# Patient Record
Sex: Male | Born: 2000 | Race: Black or African American | Hispanic: No | Marital: Single | Smoking: Never smoker
Health system: Southern US, Community
[De-identification: ages and names within clinical notes are randomized; demographics above are authoritative.]

## PROBLEM LIST (undated history)

## (undated) DIAGNOSIS — F909 Attention-deficit hyperactivity disorder, unspecified type: Secondary | ICD-10-CM

---
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
## 2021-06-30 ENCOUNTER — Emergency Department (HOSPITAL_COMMUNITY): Payer: Medicaid Other

## 2021-06-30 ENCOUNTER — Emergency Department (HOSPITAL_COMMUNITY)
Admission: EM | Admit: 2021-06-30 | Discharge: 2021-06-30 | Disposition: A | Discharge status: Home / Self Care | Payer: Medicaid Other | Attending: Emergency Medicine | Admitting: Emergency Medicine

## 2021-06-30 ENCOUNTER — Other Ambulatory Visit: Payer: Self-pay

## 2021-06-30 DIAGNOSIS — W3400XA Accidental discharge from unspecified firearms or gun, initial encounter: Secondary | ICD-10-CM | POA: Diagnosis not present

## 2021-06-30 DIAGNOSIS — S79921A Unspecified injury of right thigh, initial encounter: Secondary | ICD-10-CM | POA: Diagnosis present

## 2021-06-30 DIAGNOSIS — D72829 Elevated white blood cell count, unspecified: Secondary | ICD-10-CM | POA: Insufficient documentation

## 2021-06-30 DIAGNOSIS — S71101A Unspecified open wound, right thigh, initial encounter: Secondary | ICD-10-CM | POA: Insufficient documentation

## 2021-06-30 DIAGNOSIS — S71131A Puncture wound without foreign body, right thigh, initial encounter: Secondary | ICD-10-CM

## 2021-06-30 LAB — TYPE AND SCREEN
ABO/RH(D): AB POS
Antibody Screen: NEGATIVE

## 2021-06-30 LAB — CBC
HCT: 41.5 % (ref 39.0–52.0)
Hemoglobin: 14 g/dL (ref 13.0–17.0)
MCH: 30 pg (ref 26.0–34.0)
MCHC: 33.7 g/dL (ref 30.0–36.0)
MCV: 88.9 fL (ref 80.0–100.0)
Platelets: 258 10*3/uL (ref 150–400)
RBC: 4.67 MIL/uL (ref 4.22–5.81)
RDW: 12.1 % (ref 11.5–15.5)
WBC: 16.4 10*3/uL — ABNORMAL HIGH (ref 4.0–10.5)
nRBC: 0 % (ref 0.0–0.2)

## 2021-06-30 LAB — LACTIC ACID, PLASMA: Lactic Acid, Venous: 1.1 mmol/L (ref 0.5–1.9)

## 2021-06-30 LAB — COMPREHENSIVE METABOLIC PANEL
ALT: 31 U/L (ref 0–44)
AST: 26 U/L (ref 15–41)
Albumin: 3.7 g/dL (ref 3.5–5.0)
Alkaline Phosphatase: 54 U/L (ref 38–126)
Anion gap: 5 (ref 5–15)
BUN: 9 mg/dL (ref 6–20)
CO2: 25 mmol/L (ref 22–32)
Calcium: 8.4 mg/dL — ABNORMAL LOW (ref 8.9–10.3)
Chloride: 108 mmol/L (ref 98–111)
Creatinine, Ser: 1.19 mg/dL (ref 0.61–1.24)
GFR, Estimated: >60 mL/min (ref >60)
Glucose, Bld: 135 mg/dL — ABNORMAL HIGH (ref 70–99)
Potassium: 3.5 mmol/L (ref 3.5–5.1)
Sodium: 138 mmol/L (ref 135–145)
Total Bilirubin: 0.4 mg/dL (ref 0.3–1.2)
Total Protein: 6.7 g/dL (ref 6.5–8.1)

## 2021-06-30 LAB — I-STAT CHEM 8, ED
BUN: 9 mg/dL (ref 6–20)
Calcium, Ion: 1.15 mmol/L (ref 1.15–1.40)
Chloride: 105 mmol/L (ref 98–111)
Creatinine, Ser: 1 mg/dL (ref 0.61–1.24)
Glucose, Bld: 129 mg/dL — ABNORMAL HIGH (ref 70–99)
HCT: 41 % (ref 39.0–52.0)
Hemoglobin: 13.9 g/dL (ref 13.0–17.0)
Potassium: 3.2 mmol/L — ABNORMAL LOW (ref 3.5–5.1)
Sodium: 142 mmol/L (ref 135–145)
TCO2: 25 mmol/L (ref 22–32)

## 2021-06-30 LAB — PROTIME-INR
INR: 1 (ref 0.8–1.2)
Prothrombin Time: 12.9 seconds (ref 11.4–15.2)

## 2021-06-30 LAB — ETHANOL: Alcohol, Ethyl (B): <10 mg/dL (ref <10)

## 2021-06-30 MED ORDER — IBUPROFEN 400 MG PO TABS
400.0000 mg | ORAL_TABLET | Freq: Once | ORAL | Status: AC
  Administered 2021-06-30: 400 mg via ORAL
  Filled 2021-06-30: qty 1

## 2021-06-30 MED ORDER — HYDROCODONE-ACETAMINOPHEN 5-325 MG PO TABS
1.0000 | ORAL_TABLET | Freq: Once | ORAL | Status: AC
  Administered 2021-06-30: 1 via ORAL
  Filled 2021-06-30: qty 1

## 2021-06-30 MED ORDER — TETANUS-DIPHTH-ACELL PERTUSSIS 5-2.5-18.5 LF-MCG/0.5 IM SUSY: 0.5000 mL | PREFILLED_SYRINGE | Freq: Once | INTRAMUSCULAR | Status: DC
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
## 2021-06-30 NOTE — Progress Notes (Signed)
?   06/30/21 0035  ?Clinical Encounter Type  ?Visited With Patient not available  ?Visit Type Initial;Trauma  ?Referral From Nurse  ?Consult/Referral To Chaplain  ? ? ?Chaplain Tery Sanfilippo responded. The patient is being attended to by the medical team.There is no support person present. Chaplain remains available for additional support as needed. This note was prepared by Deneen Harts, M.Div..  For questions please contact by phone [phone].   ?

## 2021-06-30 NOTE — Progress Notes (Signed)
Orthopedic Tech Progress Note ?Patient Details:  ?Alex Macdonald ?2000-07-22 ?[id number] ? ?Ortho Devices ?Type of Ortho Device: Crutches ?Ortho Device/Splint Interventions: Ordered ?  ?Post Interventions ?Instructions Provided: Adjustment of device, Care of device, Poper ambulation with device ? ?Al Decant ?06/30/2021, 3:10 AM ? ?
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
## 2021-06-30 NOTE — ED Notes (Signed)
Pt leg rewrapped at this time  ?
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
## 2021-06-30 NOTE — Progress Notes (Signed)
Orthopedic Tech Progress Note ?Patient Details:  ?Trevaun Rendleman ?06-23-2000 ?[id number] ? ?Patient ID: Shi Blankenship, male   DOB: 01-20-2001, 21 y.o.   MRN: [id number] ?Level 2 trauma not needed at the moment.  ?Al Decant ?06/30/2021, 1:07 AM ? ?

## 2021-06-30 NOTE — ED Notes (Signed)
Patient verbalizes understanding of discharge instructions. Opportunity for questioning and answers were provided. Armband removed by staff, pt discharged from ED via wheelchair.  

## 2021-11-14 ENCOUNTER — Other Ambulatory Visit: Payer: Self-pay

## 2021-11-14 ENCOUNTER — Encounter: Payer: Self-pay | Admitting: Emergency Medicine

## 2021-11-14 ENCOUNTER — Emergency Department
Admission: EM | Admit: 2021-11-14 | Discharge: 2021-11-14 | Disposition: A | Discharge status: Home / Self Care | Payer: Medicaid Other | Attending: Emergency Medicine | Admitting: Emergency Medicine

## 2021-11-14 DIAGNOSIS — Z202 Contact with and (suspected) exposure to infections with a predominantly sexual mode of transmission: Secondary | ICD-10-CM | POA: Diagnosis present

## 2021-11-14 LAB — RAPID HIV SCREEN (HIV 1/2 AB+AG)
HIV 1/2 Antibodies: NONREACTIVE
HIV-1 P24 Antigen - HIV24: NONREACTIVE

## 2021-11-14 LAB — URINALYSIS, ROUTINE W REFLEX MICROSCOPIC
Bacteria, UA: NONE SEEN
Bilirubin Urine: NEGATIVE
Glucose, UA: NEGATIVE mg/dL
Hgb urine dipstick: NEGATIVE
Ketones, ur: NEGATIVE mg/dL
Nitrite: NEGATIVE
Protein, ur: NEGATIVE mg/dL
Specific Gravity, Urine: 1.021 (ref 1.005–1.030)
Squamous Epithelial / HPF: NONE SEEN (ref 0–5)
WBC, UA: >50 WBC/hpf — ABNORMAL HIGH (ref 0–5)
pH: 5 (ref 5.0–8.0)

## 2021-11-14 LAB — CHLAMYDIA/NGC RT PCR (ARMC ONLY)
Chlamydia Tr: DETECTED — AB
N gonorrhoeae: DETECTED — AB

## 2021-11-14 MED ORDER — CEFTRIAXONE SODIUM 1 G IJ SOLR
500.0000 mg | Freq: Once | INTRAMUSCULAR | Status: AC
  Administered 2021-11-14: 500 mg via INTRAMUSCULAR
  Filled 2021-11-14: qty 10

## 2021-11-14 MED ORDER — DOXYCYCLINE MONOHYDRATE 100 MG PO TABS: 100.0000 mg | ORAL_TABLET | Freq: Two times a day (BID) | ORAL | 0 refills | Status: AC
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
## 2021-11-14 NOTE — ED Notes (Signed)
Pt c/o red skin on face since Friday. Pt stated he has had benedryl tabs and cream. Pt admitted [facility] On Friday.

## 2021-11-14 NOTE — ED Notes (Signed)
No reaction noted from the injection  Pt and family eager to leave Provider aware

## 2021-11-14 NOTE — Discharge Instructions (Signed)
You did not wish to wait for the results of your tests today.  You were treated empirically with ceftriaxone, but you also need to take the doxycycline for 1 week in order to be fully treated.  Remember that there are many other STDs that and you should follow up with a primary care doctor to have the full panel of testing performed. Please do not have sexual intercourse until fully and properly tested and treated. Your partner(s) also needs to be tested and treated fully.  Please return for any new, worsening, or change in symptoms or other concerns.

## 2021-11-14 NOTE — ED Provider Notes (Signed)
[facility] Provider Note    Event Date/Time   First MD Initiated Contact with Patient 11/14/21 1130     (approximate)   History   SEXUALLY TRANSMITTED DISEASE   HPI  Alex Macdonald is a 21 y.o. male who presents today for evaluation of possible STD exposure.  Patient reports that he was told by someone that he recently had intercourse with that she has tested positive for chlamydia.  Patient denies any symptoms.  He has not noticed any dysuria or discharge.  He has not noticed any penile lesions.  He denies any testicular pain or abdominal pain.     Physical Exam   Triage Vital Signs: ED Triage Vitals  Enc Vitals Group     BP 11/14/21 1127 129/71     Pulse Rate 11/14/21 1127 (!) 57     Resp 11/14/21 1127 15     Temp 11/14/21 1127 97.9 F (36.6 C)     Temp Source 11/14/21 1127 Oral     SpO2 11/14/21 1127 98 %     Weight 11/14/21 1124 179 lb 14.3 oz (81.6 kg)     Height 11/14/21 1124 5\' 7"  (1.702 m)     Head Circumference --      Peak Flow --      Pain Score 11/14/21 1124 0     Pain Loc --      Pain Edu? --      Excl. in [place]? --     Most recent vital signs: Vitals:   11/14/21 1127  BP: 129/71  Pulse: (!) 57  Resp: 15  Temp: 97.9 F (36.6 C)  SpO2: 98%    Physical Exam Vitals and nursing note reviewed.  Constitutional:      General: Awake and alert. No acute distress.    Appearance: Normal appearance. The patient is normal weight.  HENT:     Head: Normocephalic and atraumatic.     Mouth: Mucous membranes are moist.  Eyes:     General: PERRL. Normal EOMs        Right eye: No discharge.        Left eye: No discharge.     Conjunctiva/sclera: Conjunctivae normal.  Cardiovascular:     Rate and Rhythm: Normal rate and regular rhythm.     Pulses: Normal pulses.     Heart sounds: Normal heart sounds Pulmonary:     Effort: Pulmonary effort is normal. No respiratory distress.     Breath sounds: Normal breath sounds.  Abdominal:      Abdomen is soft. There is no abdominal tenderness. No rebound or guarding. No distention. Normal appearing penis and testicles. No lesions. No testicular swelling or tenderness Musculoskeletal:        General: No swelling. Normal range of motion.     Cervical back: Normal range of motion and neck supple.  Skin:    General: Skin is warm and dry.     Capillary Refill: Capillary refill takes less than 2 seconds.     Findings: No rash.  Neurological:     Mental Status: The patient is awake and alert.      ED Results / Procedures / Treatments   Labs (all labs ordered are listed, but only abnormal results are displayed) Labs Reviewed  URINALYSIS, ROUTINE W REFLEX MICROSCOPIC - Abnormal; Notable for the following components:      Result Value   Color, Urine YELLOW (*)    APPearance CLOUDY (*)    Leukocytes,Ua  LARGE (*)    WBC, UA >50 (*)    All other components within normal limits  CHLAMYDIA/NGC RT PCR ([facility] ONLY)            HIV ANTIBODY (ROUTINE TESTING W REFLEX)  RAPID HIV SCREEN (HIV 1/2 AB+AG)  RPR     EKG     RADIOLOGY     PROCEDURES:  Critical Care performed:   Procedures   MEDICATIONS ORDERED IN ED: Medications  cefTRIAXone (ROCEPHIN) injection 500 mg (500 mg Intramuscular Given 11/14/21 1243)     IMPRESSION / MDM / ASSESSMENT AND PLAN / ED COURSE  I reviewed the triage vital signs and the nursing notes.   Differential diagnosis includes, but is not limited to, gonorrhea, chlamydia, HIV, syphilis, UTI, urethritis.  No testicular pain or swelling to suggest epididymitis or orchitis.  Patient denies any symptoms at all.  He agreed to work-up including gonorrhea and Chlamydia testing, HIV and syphilis testing.  However, patient did not want to wait for the results.  He reports that he is able to get the results on MyChart.  He was treated empirically with ceftriaxone IM and doxycycline.  He was advised of the importance of taking the doxycycline for the full  duration of treatment.  Patient was advised that there are many other STDs that patient could have that we do not routinely test for. Patient was advised to follow up with a primary care doctor to have the full panel of testing performed. Patient was advised to not have sexual intercourse until fully and properly tested and fully treated. Patient was advised that his partner also needs to be tested and treated. Patient understands and agrees with plan.  He was discharged in stable condition.   Patient's presentation is most consistent with acute complicated illness / injury requiring diagnostic workup.      FINAL CLINICAL IMPRESSION(S) / ED DIAGNOSES   Final diagnoses:  Exposure to STD     Rx / DC Orders   ED Discharge Orders          Ordered    doxycycline (ADOXA) 100 MG tablet  2 times daily        11/14/21 1302             Note:  This document was prepared using Dragon voice recognition software and may include unintentional dictation errors.   Keturah Shavers 11/14/21 1320    Chesley Noon, MD 11/14/21 [phone]
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
## 2021-11-14 NOTE — ED Notes (Signed)
Does not want to wait. Wants to be D/C

## 2021-11-15 ENCOUNTER — Telehealth: Payer: Self-pay | Admitting: Emergency Medicine

## 2021-11-15 LAB — HIV ANTIBODY (ROUTINE TESTING W REFLEX): HIV Screen 4th Generation wRfx: NONREACTIVE

## 2021-11-15 LAB — RPR: RPR Ser Ql: NONREACTIVE

## 2021-11-15 NOTE — Telephone Encounter (Signed)
Called patient to inform of std test results.  Gave him results and told him to make sure the other person was also treated for gonorrhea as the chart indicated the other person had chlamydia.  Also notifiy any other partners and no sex for 2 weeks after treatment.

## 2021-11-22 ENCOUNTER — Emergency Department
Admission: EM | Admit: 2021-11-22 | Discharge: 2021-11-22 | Disposition: A | Discharge status: Home / Self Care | Payer: Medicaid Other | Attending: Emergency Medicine | Admitting: Emergency Medicine

## 2021-11-22 ENCOUNTER — Emergency Department: Payer: Medicaid Other

## 2021-11-22 ENCOUNTER — Other Ambulatory Visit: Payer: Self-pay

## 2021-11-22 DIAGNOSIS — L03113 Cellulitis of right upper limb: Secondary | ICD-10-CM | POA: Insufficient documentation

## 2021-11-22 DIAGNOSIS — M79642 Pain in left hand: Secondary | ICD-10-CM | POA: Diagnosis present

## 2021-11-22 DIAGNOSIS — L03114 Cellulitis of left upper limb: Secondary | ICD-10-CM | POA: Insufficient documentation

## 2021-11-22 MED ORDER — AMOXICILLIN-POT CLAVULANATE 875-125 MG PO TABS: 1.0000 | ORAL_TABLET | Freq: Two times a day (BID) | ORAL | 0 refills | Status: AC

## 2021-11-22 MED ORDER — AMOXICILLIN-POT CLAVULANATE 875-125 MG PO TABS
1.0000 | ORAL_TABLET | Freq: Once | ORAL | Status: AC
  Administered 2021-11-22: 1 via ORAL
  Filled 2021-11-22: qty 1
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
## 2021-11-22 NOTE — Discharge Instructions (Addendum)
As we discussed please start your antibiotics this evening 11/22/2021 and continue taking 1 tablet with breakfast and dinner for the next 10 days.  Return to the emergency department for any significant increase in swelling, any fever, or any other symptom personally concerning to yourself.

## 2021-11-22 NOTE — ED Notes (Signed)
Patient transported to X-ray 

## 2021-11-22 NOTE — ED Notes (Signed)
At this time, patient does not want ice pack

## 2022-01-31 ENCOUNTER — Emergency Department
Admission: EM | Admit: 2022-01-31 | Discharge: 2022-01-31 | Disposition: A | Discharge status: Home / Self Care | Payer: No Typology Code available for payment source | Attending: Emergency Medicine | Admitting: Emergency Medicine

## 2022-01-31 DIAGNOSIS — R456 Violent behavior: Secondary | ICD-10-CM | POA: Diagnosis present

## 2022-01-31 DIAGNOSIS — R45851 Suicidal ideations: Secondary | ICD-10-CM | POA: Diagnosis not present

## 2022-01-31 DIAGNOSIS — F4323 Adjustment disorder with mixed anxiety and depressed mood: Secondary | ICD-10-CM | POA: Diagnosis not present

## 2022-01-31 DIAGNOSIS — F4325 Adjustment disorder with mixed disturbance of emotions and conduct: Secondary | ICD-10-CM | POA: Insufficient documentation

## 2022-01-31 LAB — URINE DRUG SCREEN, QUALITATIVE (ARMC ONLY)
Amphetamines, Ur Screen: NOT DETECTED
Barbiturates, Ur Screen: NOT DETECTED
Benzodiazepine, Ur Scrn: NOT DETECTED
Cannabinoid 50 Ng, Ur ~~LOC~~: POSITIVE — AB
Cocaine Metabolite,Ur ~~LOC~~: NOT DETECTED
MDMA (Ecstasy)Ur Screen: NOT DETECTED
Methadone Scn, Ur: NOT DETECTED
Opiate, Ur Screen: NOT DETECTED
Phencyclidine (PCP) Ur S: NOT DETECTED
Tricyclic, Ur Screen: NOT DETECTED

## 2022-01-31 LAB — CBC
HCT: 41.5 % (ref 39.0–52.0)
Hemoglobin: 13.8 g/dL (ref 13.0–17.0)
MCH: 29.9 pg (ref 26.0–34.0)
MCHC: 33.3 g/dL (ref 30.0–36.0)
MCV: 89.8 fL (ref 80.0–100.0)
Platelets: 287 10*3/uL (ref 150–400)
RBC: 4.62 MIL/uL (ref 4.22–5.81)
RDW: 13.2 % (ref 11.5–15.5)
WBC: 15.3 10*3/uL — ABNORMAL HIGH (ref 4.0–10.5)
nRBC: 0 % (ref 0.0–0.2)

## 2022-01-31 LAB — COMPREHENSIVE METABOLIC PANEL
ALT: 28 U/L (ref 0–44)
AST: 21 U/L (ref 15–41)
Albumin: 4.2 g/dL (ref 3.5–5.0)
Alkaline Phosphatase: 57 U/L (ref 38–126)
Anion gap: 7 (ref 5–15)
BUN: 19 mg/dL (ref 6–20)
CO2: 24 mmol/L (ref 22–32)
Calcium: 9.2 mg/dL (ref 8.9–10.3)
Chloride: 113 mmol/L — ABNORMAL HIGH (ref 98–111)
Creatinine, Ser: 1.24 mg/dL (ref 0.61–1.24)
GFR, Estimated: >60 mL/min (ref >60)
Glucose, Bld: 104 mg/dL — ABNORMAL HIGH (ref 70–99)
Potassium: 3.9 mmol/L (ref 3.5–5.1)
Sodium: 144 mmol/L (ref 135–145)
Total Bilirubin: 0.7 mg/dL (ref 0.3–1.2)
Total Protein: 7.7 g/dL (ref 6.5–8.1)

## 2022-01-31 LAB — ACETAMINOPHEN LEVEL: Acetaminophen (Tylenol), Serum: <10 ug/mL — ABNORMAL LOW (ref 10–30)

## 2022-01-31 LAB — ETHANOL: Alcohol, Ethyl (B): <10 mg/dL (ref <10)

## 2022-01-31 LAB — SALICYLATE LEVEL: Salicylate Lvl: <7 mg/dL — ABNORMAL LOW (ref 7.0–30.0)

## 2022-01-31 MED ORDER — LORAZEPAM 2 MG/ML IJ SOLN
2.0000 mg | INTRAMUSCULAR | Status: DC | PRN
Start: 2022-01-31 — End: 2022-01-31

## 2022-01-31 MED ORDER — DIPHENHYDRAMINE HCL 50 MG/ML IJ SOLN
50.0000 mg | Freq: Four times a day (QID) | INTRAMUSCULAR | Status: DC | PRN
Start: 2022-01-31 — End: 2022-01-31

## 2022-01-31 MED ORDER — HALOPERIDOL LACTATE 5 MG/ML IJ SOLN: 5.0000 mg | Freq: Four times a day (QID) | INTRAMUSCULAR | Status: DC | PRN

## 2022-01-31 NOTE — ED Notes (Signed)
Hospital meal provided.  100% consumed, pt tolerated w/o complaints.  Waste discarded appropriately.   

## 2022-01-31 NOTE — Consult Note (Signed)
[facility] Face-to-Face Psychiatry Consult   Reason for Consult:  Psych evaluation Referring Physician:  Dr. Vicente Males Patient Identification: Alex Macdonald MRN:  [id number] Principal Diagnosis: Suicidal ideation Diagnosis:  Principal Problem:   Suicidal ideation Active Problems:   Adjustment disorder with mixed disturbance of emotions and conduct   Total Time spent with patient: 45 minutes  Subjective:   " If I wanted to kill myself I would have done it"  HPI:  Alex Macdonald, 21 y.o., Alex Macdonald patient seen by this provider; chart reviewed and consulted with Dr. Vicente Males on 11/27/Alex.  On approach Alex Macdonald is guarded and not open to participating in the assessment.  This Clinical research associate explained why it was important to answer questions and to participate.  He then began to open up just slightly. It becomes apparent that patient is still very upset about 1. Whatever he was arguing with his girlfriend about and 2. Being here in the ER.   Per chart review, patient has a history or adjustment disorder and ADHD. There is no prior hx of SA.  He presented to the er as a recommendation by [facility] for passive SI, where he states,  "I never meant that I was going to hurt myself." He said, "I only thought if I was not around anymore, would people miss me or be sad that I am gone."    It was discussed with both providers that the patient does not meet criteria to be admitted to the psychiatric inpatient unit, and that an observation and reassessment this am may be sufficient for discharge, with recommended follow-up.  The patient does not appear to be responding to internal or external stimuli. Neither is the patient presenting with any delusional thinking. The patient denies auditory or visual hallucinations. The patient denies suicidal, homicidal, or self-harm ideations. The patient is not presenting with any psychotic or paranoid behaviors. He denies changes in sleep or appetite. During  encounter with the patient, he was able  to answer questions.     Disposition:  reassess in the am   Past Psychiatric History: Adjustment disorder  Risk to Self:   Risk to Others:   Prior Inpatient Therapy:   Prior Outpatient Therapy:    Past Medical History:  Past Medical History:  Diagnosis Date   ADHD (attention deficit hyperactivity disorder)    No past surgical history on file. Family History: No family history on file. Family Psychiatric  History:  Social History:  Social History   Substance and Sexual Activity  Alcohol Use No     Social History   Substance and Sexual Activity  Drug Use Never    Social History   Socioeconomic History   Marital status: Single    Spouse name: Not on file   Number of children: Not on file   Years of education: Not on file   Highest education level: Not on file  Occupational History   Not on file  Tobacco Use   Smoking status: Never   Smokeless tobacco: Never  Substance and Sexual Activity   Alcohol use: No   Drug use: Never   Sexual activity: Not on file  Other Topics Concern   Not on file  Social History Narrative   Not on file   Social Determinants of Health   Financial Resource Strain: Not on file  Food Insecurity: Not on file  Transportation Needs: Not on file  Physical Activity: Not on file  Stress: Not on file  Social Connections: Not on file  Additional Social History:    Allergies:  No Known Allergies  Labs: No results found for this or any previous visit (from the past 48 hour(s)).  Current Facility-Administered Medications  Medication Dose Route Frequency Provider Last Rate Last Admin   diphenhydrAMINE (BENADRYL) injection 50 mg  50 mg Intramuscular Q6H PRN Bradler, Clent Jacks, MD       haloperidol lactate (HALDOL) injection 5 mg  5 mg Intramuscular Q6H PRN Bradler, Clent Jacks, MD       LORazepam (ATIVAN) injection 2 mg  2 mg Intramuscular Q4H PRN Merwyn Katos, MD       Current Outpatient Medications  Medication Sig Dispense Refill    amoxicillin-clavulanate (AUGMENTIN) 875-125 MG tablet Take 1 tablet by mouth 2 (two) times daily. 20 tablet 0   cloNIDine (CATAPRES) 0.1 MG tablet Take 0.1 mg by mouth 2 (two) times daily. (Patient not taking: Reported on 11/14/2021)      Musculoskeletal: Strength & Muscle Tone: within normal limits Gait & Station: normal Patient leans: N/A   Psychiatric Specialty Exam:  Presentation  General Appearance: Appropriate for Environment  Eye Contact:Fair  Speech:Clear and Coherent  Speech Volume:Decreased  Handedness:Right   Mood and Affect  Mood:Anxious; Labile; Irritable  Affect:Appropriate; Flat   Thought Process  Thought Processes:Coherent  Descriptions of Associations:Intact  Orientation:Full (Time, Place and Person)  Thought Content:Illogical; WDL  History of Schizophrenia/Schizoaffective disorder:No data recorded Duration of Psychotic Symptoms:No data recorded Hallucinations:Hallucinations: None  Ideas of Reference:None  Suicidal Thoughts:Suicidal Thoughts: No  Homicidal Thoughts:Homicidal Thoughts: No   Sensorium  Memory:Immediate Fair; Recent Fair  Judgment:Poor  Insight:Poor   Executive Functions  Concentration:Fair  Attention Span:Fair  Recall:Fair  Fund of Knowledge:Fair  Language:Fair   Psychomotor Activity  Psychomotor Activity:Psychomotor Activity: Normal   Assets  Assets:Communication Skills; Financial Resources/Insurance; Housing; Physical Health; Social Support   Sleep  Sleep:Sleep: Fair   Physical Exam: Physical Exam Vitals and nursing note reviewed.    ROS Blood pressure (!) 148/94, pulse 88, temperature 98.1 F (36.7 C), temperature source Oral, resp. rate 17, height 5\' 7"  (1.702 m), weight 69.5 kg, SpO2 100 %. Body mass index is 24 kg/m.   Disposition: No evidence of imminent risk to self or others at present.   Patient does not meet criteria for psychiatric inpatient admission. Refer to IOP. Discussed  crisis plan, support from social network, calling 911, coming to the Emergency Department, and calling Suicide Hotline.  , NP 01/31/2022 5:00 AM

## 2022-01-31 NOTE — Discharge Instructions (Addendum)
Call your outpatient provider to talk about getting back on medication for ADHD. You can also ogo to [facility] for mental Health Services: [address], [place]. The contact for [facility] is Unk Pinto [phone]

## 2022-01-31 NOTE — Consult Note (Signed)
[facility]  01/31/2022 10:54 AM Alex Macdonald  MRN:  [id number]   Method of visit?: Face to Face   Subjective:"I am not suicidal" Principal Problem: Suicidal ideation Diagnosis:  Principal Problem:   Suicidal ideation Active Problems:   Adjustment disorder with mixed disturbance of emotions and conduct   Adjustment disorder with mixed anxiety and depressed mood  Total Time spent with patient: 20 minutes  Past Psychiatric History: Reassessment. Patient continues to deny suicidal or homicidal ideations. No unsafe behaviors in the ED. Patient denies auditory and visual hallucinations. Does not appear to be responding to internal stimuli. Is calm and cooperative.  Collateral gained from patient's mother, with whom patient lives [place](Alex Macdonald [phone]).  She states that patient does tend to be impulsive. He has ADHD and has not been medicated for several years, but he has talked about going back on medication.   Patient no longer meets criteria for involuntary commitment. IVC released     Past Medical History:  Past Medical History:  Diagnosis Date   ADHD (attention deficit hyperactivity disorder)    No past surgical history on file. Family History: No family history on file. Family Psychiatric  History:  Social History:  Social History   Substance and Sexual Activity  Alcohol Use No     Social History   Substance and Sexual Activity  Drug Use Never    Social History   Socioeconomic History   Marital status: Single    Spouse name: Not on file   Number of children: Not on file   Years of education: Not on file   Highest education level: Not on file  Occupational History   Not on file  Tobacco Use   Smoking status: Never   Smokeless tobacco: Never  Substance and Sexual Activity   Alcohol use: No   Drug use: Never   Sexual activity: Not on file  Other Topics Concern   Not on file  Social History Narrative   Not on file   Social Determinants of  Health   Financial Resource Strain: Not on file  Food Insecurity: Not on file  Transportation Needs: Not on file  Physical Activity: Not on file  Stress: Not on file  Social Connections: Not on file    Sleep: Good  Appetite:  Good  Current Medications: Current Facility-Administered Medications  Medication Dose Route Frequency Provider Last Rate Last Admin   diphenhydrAMINE (BENADRYL) injection 50 mg  50 mg Intramuscular Q6H PRN Bradler, Clent JacksEvan K, MD       haloperidol lactate (HALDOL) injection 5 mg  5 mg Intramuscular Q6H PRN Bradler, Clent JacksEvan K, MD       LORazepam (ATIVAN) injection 2 mg  2 mg Intramuscular Q4H PRN Merwyn KatosBradler, Evan K, MD       Current Outpatient Medications  Medication Sig Dispense Refill   amoxicillin-clavulanate (AUGMENTIN) 875-125 MG tablet Take 1 tablet by mouth 2 (two) times daily. (Patient not taking: Reported on 01/31/2022) 20 tablet 0   cloNIDine (CATAPRES) 0.1 MG tablet Take 0.1 mg by mouth 2 (two) times daily. (Patient not taking: Reported on 11/14/2021)      Lab Results:  Results for orders placed or performed during the hospital encounter of 01/31/22 (from the past 48 hour(s))  Comprehensive metabolic panel     Status: Abnormal   Collection Time: 01/31/22  4:00 AM  Result Value Ref Range   Sodium 144 135 - 145 mmol/L   Potassium 3.9 3.5 - 5.1 mmol/L  Chloride 113 (H) 98 - 111 mmol/L   CO2 24 22 - 32 mmol/L   Glucose, Bld 104 (H) 70 - 99 mg/dL    Comment: Glucose reference range applies only to samples taken after fasting for at least 8 hours.   BUN 19 6 - 20 mg/dL   Creatinine, Ser 4.40 0.61 - 1.24 mg/dL   Calcium 9.2 8.9 - 34.7 mg/dL   Total Protein 7.7 6.5 - 8.1 g/dL   Albumin 4.2 3.5 - 5.0 g/dL   AST 21 15 - 41 U/L   ALT 28 0 - 44 U/L   Alkaline Phosphatase 57 38 - 126 U/L   Total Bilirubin 0.7 0.3 - 1.2 mg/dL   GFR, Estimated >42 >59 mL/min    Comment: (NOTE) Calculated using the CKD-EPI Creatinine Equation (2021)    Anion gap 7 5 - 15     Comment: Performed at [facility], [address] [place], [place] [address]  Ethanol     Status: None   Collection Time: 01/31/22  4:00 AM  Result Value Ref Range   Alcohol, Ethyl (B) <10 <10 mg/dL    Comment: (NOTE) Lowest detectable limit for serum alcohol is 10 mg/dL.  For medical purposes only. Performed at [facility], [address] [place], [place] [address]   Salicylate level     Status: Abnormal   Collection Time: 01/31/22  4:00 AM  Result Value Ref Range   Salicylate Lvl <7.0 (L) 7.0 - 30.0 mg/dL    Comment: Performed at [facility], [address] [place], [place] [address]  Acetaminophen level     Status: Abnormal   Collection Time: 01/31/22  4:00 AM  Result Value Ref Range   Acetaminophen (Tylenol), Serum <10 (L) 10 - 30 ug/mL    Comment: (NOTE) Therapeutic concentrations vary significantly. A range of 10-30 ug/mL  may be an effective concentration for many patients. However, some  are best treated at concentrations outside of this range. Acetaminophen concentrations >150 ug/mL at 4 hours after ingestion  and >50 ug/mL at 12 hours after ingestion are often associated with  toxic reactions.  Performed at [facility], [address] [place], [place] [address]   cbc     Status: Abnormal   Collection Time: 01/31/22  4:00 AM  Result Value Ref Range   WBC 15.3 (H) 4.0 - 10.5 K/uL   RBC 4.62 4.22 - 5.81 MIL/uL   Hemoglobin 13.8 13.0 - 17.0 g/dL   HCT 06.3 01.6 - 01.0 %   MCV 89.8 80.0 - 100.0 fL   MCH 29.9 26.0 - 34.0 pg   MCHC 33.3 30.0 - 36.0 g/dL   RDW 93.2 35.5 - 73.2 %   Platelets 287 150 - 400 K/uL   nRBC 0.0 0.0 - 0.2 %    Comment: Performed at [facility], [address], [place], [place] [address]  Urine Drug Screen, Qualitative     Status: Abnormal   Collection Time: 01/31/22 10:00 AM  Result Value Ref Range   Tricyclic, Ur Screen NONE DETECTED NONE DETECTED   Amphetamines, Ur Screen NONE  DETECTED NONE DETECTED   MDMA (Ecstasy)Ur Screen NONE DETECTED NONE DETECTED   Cocaine Metabolite,Ur [place] NONE DETECTED NONE DETECTED   Opiate, Ur Screen NONE DETECTED NONE DETECTED   Phencyclidine (PCP) Ur S NONE DETECTED NONE DETECTED   Cannabinoid 50 Ng, Ur [place] POSITIVE (A) NONE DETECTED   Barbiturates, Ur Screen NONE DETECTED NONE DETECTED  Benzodiazepine, Ur Scrn NONE DETECTED NONE DETECTED   Methadone Scn, Ur NONE DETECTED NONE DETECTED    Comment: (NOTE) Tricyclics + metabolites, urine    Cutoff 1000 ng/mL Amphetamines + metabolites, urine  Cutoff 1000 ng/mL MDMA (Ecstasy), urine              Cutoff 500 ng/mL Cocaine Metabolite, urine          Cutoff 300 ng/mL Opiate + metabolites, urine        Cutoff 300 ng/mL Phencyclidine (PCP), urine         Cutoff 25 ng/mL Cannabinoid, urine                 Cutoff 50 ng/mL Barbiturates + metabolites, urine  Cutoff 200 ng/mL Benzodiazepine, urine              Cutoff 200 ng/mL Methadone, urine                   Cutoff 300 ng/mL  The urine drug screen provides only a preliminary, unconfirmed analytical test result and should not be used for non-medical purposes. Clinical consideration and professional judgment should be applied to any positive drug screen result due to possible interfering substances. A more specific alternate chemical method must be used in order to obtain a confirmed analytical result. Gas chromatography / mass spectrometry (GC/MS) is the preferred confirm atory method. Performed at [facility], [address] [place], [place] [address]     Blood Alcohol level:  Lab Results  Component Value Date   Avala <10 01/31/2022   ETH <10 06/30/2021    Physical Findings: AIMS:  , ,  ,  ,    CIWA:    COWS:     Musculoskeletal: Strength & Muscle Tone: within normal limits Gait & Station: normal Patient leans: N/A  Psychiatric Specialty Exam:  Presentation  General Appearance:  Casual  Eye  Contact: Fair  Speech: Clear and Coherent  Speech Volume: Normal  Handedness: Right   Mood and Affect  Mood: Euthymic  Affect: Blunt   Thought Process  Thought Processes: Coherent  Descriptions of Associations:Intact  Orientation:Full (Time, Place and Person)  Thought Content:WDL  History of Schizophrenia/Schizoaffective disorder:No data recorded Duration of Psychotic Symptoms:No data recorded Hallucinations:Hallucinations: None  Ideas of Reference:None  Suicidal Thoughts:Suicidal Thoughts: No  Homicidal Thoughts:Homicidal Thoughts: No   Sensorium  Memory: Immediate Good  Judgment: Fair  Insight: Poor   Executive Functions  Concentration: Fair  Attention Span: Fair  Recall: Fair  Fund of Knowledge: Fair  Language: Fair   Psychomotor Activity  Psychomotor Activity: Psychomotor Activity: Normal   Assets  Assets: Housing   Sleep  Sleep: Sleep: Good    Physical Exam: Physical Exam Vitals and nursing note reviewed.  HENT:     Head: Normocephalic.     Nose: No congestion or rhinorrhea.  Eyes:     General:        Right eye: No discharge.        Left eye: No discharge.  Pulmonary:     Effort: Pulmonary effort is normal.  Musculoskeletal:        General: Normal range of motion.     Cervical back: Normal range of motion.  Skin:    General: Skin is dry.  Neurological:     Mental Status: He is alert and oriented to person, place, and time.  Psychiatric:        Attention and Perception: Attention normal.  Mood and Affect: Affect is blunt.        Speech: Speech normal.        Behavior: Behavior normal.        Thought Content: Thought content normal. Thought content is not paranoid. Thought content does not include homicidal or suicidal ideation.        Cognition and Memory: Cognition normal.        Judgment: Judgment is impulsive.    Review of Systems  Constitutional: Negative.   HENT: Negative.    Eyes:  Negative.   Respiratory: Negative.    Musculoskeletal: Negative.   Psychiatric/Behavioral:  Positive for depression (denies). Negative for suicidal ideas.    Blood pressure 119/60, pulse 82, temperature 98 F (36.7 C), temperature source Oral, resp. rate 14, height 5\' 7"  (1.702 m), weight 69.5 kg, SpO2 99 %. Body mass index is 24 kg/m.  Treatment Plan Summary: Plan Patient does not meet criteria for inpatient psychiatric hospitalization. Refer to outpatient provider to discus possibility of getting back on his medication for ADHD. Also refer to [facility] for mental health services. Reviewed with EDP   , NP 01/31/2022, 10:54 AM

## 2022-01-31 NOTE — ED Triage Notes (Signed)
Pt arrived via [facility] from his home after pt and girlfriend had argument and pt expressed to girlfriend that he was going to hurt himself. Girlfriend called 911. Per affidavit, pt was combative the entire time with officers and continuously stated he did not want to live anymore and wanted to die. Pt arrived to ED in forensic restraints in which were removed for assessment. Pt refusing to answer SI and HI questions at this time and sts, "It aint illegal to say you want to hurt yourself. A lot of people say it and dont mean it. If people was really going to do it and kill themselves, they wouldn't talk about it."

## 2022-01-31 NOTE — ED Notes (Signed)
IVC/ Re Assess this Am for Out patient services

## 2022-01-31 NOTE — ED Notes (Signed)
Pt dressed into burgundy scrubs per facility policy with this RN, Erie Noe RN Myrlene Broker RN, and security officer present. Pt belongings brought in by [employer] were inventoried by this RN and Research scientist (life sciences) and are included in the following pt belongings listed. Pt belongings include: black pants, red underwear, pair of green slides, black cell phone, visa debit card, and green electronic cigarette. Pt belongings placed in single belongings bag and bag labeled with appropriate pt identifiers.

## 2022-01-31 NOTE — ED Provider Notes (Signed)
[facility] Provider Note   Event Date/Time   First MD Initiated Contact with Patient 01/31/22 [phone]     (approximate) History  Aggressive Behavior  HPI Alex Macdonald is a 21 y.o. male with a stated past medical history of ADHD and MDD who presents under IVC by law enforcement after making suicidal statements.  Patient states that he did make suicidal statements however he does not have a specific plan as "if I did I would not be here".  Patient states that he has had at least 4 suicide attempts in the past.  Patient states that he does not want to talk about those attempts at this time.  Patient currently denies any homicidal ideation or auditory/visual hallucinations ROS: Patient currently denies any vision changes, tinnitus, difficulty speaking, facial droop, sore throat, chest pain, shortness of breath, abdominal pain, nausea/vomiting/diarrhea, dysuria, or weakness/numbness/paresthesias in any extremity   Physical Exam  Triage Vital Signs: ED Triage Vitals  Enc Vitals Group     BP      Pulse      Resp      Temp      Temp src      SpO2      Weight      Height      Head Circumference      Peak Flow      Pain Score      Pain Loc      Pain Edu?      Excl. in GC?    Most recent vital signs: Vitals:   01/31/22 0344 01/31/22 0350  BP: (!) 148/94 (!) 148/94  Pulse: 88 88  Resp: 17 17  Temp: 98.1 F (36.7 C) 98.1 F (36.7 C)  SpO2: 100% 100%   General: Awake, oriented x4. CV:  Good peripheral perfusion.  Resp:  Normal effort.  Abd:  No distention.  Other:  Young adult African-American male laying in bed in no acute distress ED Results / Procedures / Treatments  Labs (all labs ordered are listed, but only abnormal results are displayed) Labs Reviewed  COMPREHENSIVE METABOLIC PANEL - Abnormal; Notable for the following components:      Result Value   Chloride 113 (*)    Glucose, Bld 104 (*)    All other components within normal limits  CBC -  Abnormal; Notable for the following components:   WBC 15.3 (*)    All other components within normal limits  ETHANOL  SALICYLATE LEVEL  ACETAMINOPHEN LEVEL  URINE DRUG SCREEN, QUALITATIVE ([facility] ONLY)   PROCEDURES: Critical Care performed: No Procedures MEDICATIONS ORDERED IN ED: Medications  haloperidol lactate (HALDOL) injection 5 mg (has no administration in time range)  LORazepam (ATIVAN) injection 2 mg (has no administration in time range)  diphenhydrAMINE (BENADRYL) injection 50 mg (has no administration in time range)   IMPRESSION / MDM / ASSESSMENT AND PLAN / ED COURSE  I reviewed the triage vital signs and the nursing notes.                             The patient is on the cardiac monitor to evaluate for evidence of arrhythmia and/or significant heart rate changes. Patient's presentation is most consistent with acute presentation with potential threat to life or bodily function. Thoughts are linear and organized, and patient has no AH, VH, or HI. Prior suicide attempt: Multiple Prior Psychiatric Hospitalizations: Denies  Clinically patient displays no overt toxidrome;  they are well appearing, with low suspicion for toxic ingestion given history and exam. Thoughts unlikely 2/2 anemia, hypothyroidism, infection, or ICH.  Consult: Psychiatry to evaluate patient for potential hold for danger to self. Disposition: Plan reassess patient in the morning for further disposition.    FINAL CLINICAL IMPRESSION(S) / ED DIAGNOSES   Final diagnoses:  Suicidal ideation   Rx / DC Orders   ED Discharge Orders     None      Note:  This document was prepared using Dragon voice recognition software and may include unintentional dictation errors.   Merwyn Katos, MD 01/31/22 [phone]

## 2022-01-31 NOTE — ED Notes (Addendum)
Returned belongings. Mother coming to pick up patient from lobby. Verified correct patient and correct discharge papers given. Pt alert and oriented X 4, stable for discharge. RR even and unlabored, color WNL. Discussed discharge instructions and follow-up as directed. Discharge medications discussed, when prescribed. Pt had opportunity to ask questions, and RN available to provide patient and/or family education.  Did not want to stay for repeat VS.

## 2023-01-13 ENCOUNTER — Other Ambulatory Visit: Payer: Self-pay

## 2023-01-13 ENCOUNTER — Emergency Department
Admission: EM | Admit: 2023-01-13 | Discharge: 2023-01-13 | Disposition: A | Discharge status: Home / Self Care | Payer: MEDICAID | Attending: Student in an Organized Health Care Education/Training Program | Admitting: Student in an Organized Health Care Education/Training Program

## 2023-01-13 DIAGNOSIS — R21 Rash and other nonspecific skin eruption: Secondary | ICD-10-CM | POA: Insufficient documentation

## 2023-01-13 LAB — HIV ANTIBODY (ROUTINE TESTING W REFLEX): HIV Screen 4th Generation wRfx: NONREACTIVE

## 2023-01-13 NOTE — ED Provider Notes (Signed)
[facility] Provider Note    Event Date/Time   First MD Initiated Contact with Patient 01/13/23 1157     (approximate)   History   Rash   HPI  Alex Macdonald is a 22 y.o. male who presents today for evaluation of rash to his palms and soles.  He reports that this has been present for the last couple of days.  He reports that it is noticeable, though not uncomfortable or itchy.  He reports that a child in his life recently had a similar rash and cold.  He reports that he has also had a sexually-transmitted disease in the past.  He has not had any burning with urination or discharge currently.  He denies any recent high risk sexual activity.  No fevers or chills.  Denies lesions to his penis currently or recently.  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          Physical Exam   Triage Vital Signs: ED Triage Vitals  Encounter Vitals Group     BP 01/13/23 1050 132/79     Systolic BP Percentile --      Diastolic BP Percentile --      Pulse Rate 01/13/23 1050 97     Resp 01/13/23 1050 18     Temp 01/13/23 1050 98 F (36.7 C)     Temp src --      SpO2 01/13/23 1050 98 %     Weight --      Height --      Head Circumference --      Peak Flow --      Pain Score 01/13/23 1049 3     Pain Loc --      Pain Education --      Exclude from Growth Chart --     Most recent vital signs: Vitals:   01/13/23 1050  BP: 132/79  Pulse: 97  Resp: 18  Temp: 98 F (36.7 C)  SpO2: 98%    Physical Exam Vitals and nursing note reviewed.  Constitutional:      General: Awake and alert. No acute distress.    Appearance: Normal appearance. The patient is normal weight.  HENT:     Head: Normocephalic and atraumatic.     Mouth: Mucous membranes are moist.  Eyes:      General: PERRL. Normal EOMs        Right eye: No discharge.        Left eye: No discharge.     Conjunctiva/sclera: Conjunctivae normal.  Cardiovascular:     Rate and Rhythm: Normal rate and regular rhythm.     Pulses: Normal pulses.  Pulmonary:     Effort: Pulmonary effort is normal. No respiratory distress.     Breath sounds: Normal breath sounds.  Abdominal:     Abdomen is soft. There is no abdominal tenderness. No rebound or guarding. No distention. Musculoskeletal:        General: No swelling. Normal range of motion.     Cervical back: Normal range of motion and neck supple.  Skin:    General: Skin is warm and dry.     Capillary Refill: Capillary refill takes less than 2 seconds.     Findings: Very faint macular rash to the soles of bilateral feet, 2  isolated lesions noted to hand.  They are not raised.  No vesicles or bullae.  No rash elsewhere to body.  Nontender to palpation. Neurological:     Mental Status: The patient is awake and alert.      ED Results / Procedures / Treatments   Labs (all labs ordered are listed, but only abnormal results are displayed) Labs Reviewed  RPR  HIV ANTIBODY (ROUTINE TESTING W REFLEX)     EKG     RADIOLOGY     PROCEDURES:  Critical Care performed:   Procedures   MEDICATIONS ORDERED IN ED: Medications - No data to display   IMPRESSION / MDM / ASSESSMENT AND PLAN / ED COURSE  I reviewed the triage vital signs and the nursing notes.   Differential diagnosis includes, but is not limited to, secondary syphilis, coxsackievirus, other viral syndrome.  No history of IV drug use, no splinter hemorrhages or Osler's nodes, fever, chest pain, shortness of breath, or history of IV drug use to suggest endocarditis as a cause of his rash today.  No recent tick exposures, no rash elsewhere, no constitutional symptoms, not consistent with [facility] spotted fever.  No desquamating rash, no infectious type symptoms, not consistent  with toxic shock syndrome.  No intraoral lesions.  I reviewed the patient's chart.  He had STD testing on 11/14/2021 at which time he had a nonreactive RPR.    Will retest RPR today given his history of sexually transmitted diseases.  He opted to forego empiric treatment.  He understands that he may find the results on MyChart.  Discussed no sexual activity until we get this result.  He denied other STD testing such as gonorrhea and chlamydia, though agreed to HIV testing.  He denies any symptoms such as dysuria or urethral discharge.  Given that a child in his life currently has the same symptoms it is also possible that he has coxsackievirus.  We discussed the importance of treatment if he is syphilis positive.  Patient understands and agrees with plan.  He was discharged in stable condition.  Patient's presentation is most consistent with acute complicated illness / injury requiring diagnostic workup.    FINAL CLINICAL IMPRESSION(S) / ED DIAGNOSES   Final diagnoses:  Rash and nonspecific skin eruption     Rx / DC Orders   ED Discharge Orders     None        Note:  This document was prepared using Dragon voice recognition software and may include unintentional dictation errors.   Keturah Shavers 01/13/23 Para Skeans, MD 01/13/23 [phone]

## 2023-01-13 NOTE — Discharge Instructions (Signed)
Please find the results of your test on MyChart, as this will require antibiotics if it is positive.  Please return for any new, worsening, or change in symptoms or other concerns.  It was a pleasure caring for you today.

## 2023-01-13 NOTE — ED Triage Notes (Signed)
Pt comes with c/o rash ton hands and feet. Pt states this started two days ago.

## 2023-01-14 LAB — RPR: RPR Ser Ql: NONREACTIVE
